# Patient Record
Sex: Male | Born: 1985 | Race: Black or African American | Hispanic: No | Marital: Married | Smoking: Current every day smoker
Health system: Southern US, Community
[De-identification: ages and names within clinical notes are randomized; demographics above are authoritative.]

## PROBLEM LIST (undated history)

## (undated) DIAGNOSIS — J45909 Unspecified asthma, uncomplicated: Secondary | ICD-10-CM

## (undated) DIAGNOSIS — M1990 Unspecified osteoarthritis, unspecified site: Secondary | ICD-10-CM

## (undated) DIAGNOSIS — J40 Bronchitis, not specified as acute or chronic: Secondary | ICD-10-CM

## (undated) DIAGNOSIS — R569 Unspecified convulsions: Secondary | ICD-10-CM

---

## 2019-05-09 ENCOUNTER — Emergency Department (HOSPITAL_COMMUNITY)
Admission: EM | Admit: 2019-05-09 | Discharge: 2019-05-09 | Disposition: A | Discharge status: Home / Self Care | Payer: Self-pay | Attending: Emergency Medicine | Admitting: Emergency Medicine

## 2019-05-09 ENCOUNTER — Emergency Department (HOSPITAL_COMMUNITY): Payer: Self-pay

## 2019-05-09 ENCOUNTER — Other Ambulatory Visit: Payer: Self-pay

## 2019-05-09 ENCOUNTER — Encounter (HOSPITAL_COMMUNITY): Payer: Self-pay | Admitting: Emergency Medicine

## 2019-05-09 DIAGNOSIS — Y9389 Activity, other specified: Secondary | ICD-10-CM | POA: Insufficient documentation

## 2019-05-09 DIAGNOSIS — Y929 Unspecified place or not applicable: Secondary | ICD-10-CM | POA: Insufficient documentation

## 2019-05-09 DIAGNOSIS — X500XXA Overexertion from strenuous movement or load, initial encounter: Secondary | ICD-10-CM | POA: Insufficient documentation

## 2019-05-09 DIAGNOSIS — S39012A Strain of muscle, fascia and tendon of lower back, initial encounter: Secondary | ICD-10-CM | POA: Insufficient documentation

## 2019-05-09 DIAGNOSIS — Y999 Unspecified external cause status: Secondary | ICD-10-CM | POA: Insufficient documentation

## 2019-05-09 HISTORY — DX: Unspecified osteoarthritis, unspecified site: M19.90

## 2019-05-09 MED ORDER — NAPROXEN 500 MG PO TABS: 500.0000 mg | ORAL_TABLET | Freq: Two times a day (BID) | ORAL | 0 refills | Status: DC

## 2019-05-09 MED ORDER — METHOCARBAMOL 500 MG PO TABS: 500.0000 mg | ORAL_TABLET | Freq: Two times a day (BID) | ORAL | 0 refills | Status: DC

## 2019-05-09 NOTE — ED Notes (Signed)
Patient transported to X-ray 

## 2019-05-09 NOTE — ED Provider Notes (Signed)
[facility] EMERGENCY DEPARTMENT Provider Note   CSN: [id number] Arrival date & time: 05/09/19  6948     History Chief Complaint  Patient presents with  . Back Pain    Lawrence Robinson is a 34 y.o. male who presents to ED with a chief complaint of back pain.  States that 2 days ago he was lifting a heavy Tanner of water off of the counter and felt a "pinching" in his lower back.  Has had waxing and waning pain 2 days ago with symptoms improving yesterday.  Today coughed and then felt a similar pinching sensation in his lower back.  Pain will intermittently radiate to his flank and down his legs depending on his positioning.  Reports history of similar symptoms several years ago during workouts.  He has tried Advil yesterday with some improvement in his pain.  Denies any dysuria, hematuria, injuries or falls, numbness in arms or legs, saddle anesthesia, loss of bowel bladder function, history of cancer, she of IV drug use or prior back surgeries. He remains ambulatory.  HPI     Past Medical History:  Diagnosis Date  . Arthritis     There are no problems to display for this patient.      No family history on file.  Social History   Tobacco Use  . Smoking status: Not on file  Substance Use Topics  . Alcohol use: Not on file  . Drug use: Not on file    Home Medications Prior to Admission medications   Medication Sig Start Date End Date Taking? Authorizing Provider  methocarbamol (ROBAXIN) 500 MG tablet Take 1 tablet (500 mg total) by mouth 2 (two) times daily. 05/09/19   Ayren Zumbro, PA-C  naproxen (NAPROSYN) 500 MG tablet Take 1 tablet (500 mg total) by mouth 2 (two) times daily. 05/09/19   Delia Heady, PA-C    Allergies    Patient has no known allergies.  Review of Systems   Review of Systems  Constitutional: Negative for chills and fever.  Gastrointestinal: Negative for diarrhea and vomiting.  Musculoskeletal: Positive for back pain and myalgias.    Skin: Negative for wound.  Neurological: Negative for weakness and numbness.    Physical Exam Updated Vital Signs BP 126/79 (BP Location: Left Arm)   Pulse 63   Temp 98 F (36.7 C)   Resp 16   Ht 6' (1.829 m)   Wt 79.8 kg   SpO2 99%   BMI 23.87 kg/m   Physical Exam Vitals and nursing note reviewed.  Constitutional:      General: He is not in acute distress.    Appearance: He is well-developed. He is not diaphoretic.  HENT:     Head: Normocephalic and atraumatic.  Eyes:     General: No scleral icterus.    Conjunctiva/sclera: Conjunctivae normal.  Cardiovascular:     Rate and Rhythm: Normal rate and regular rhythm.     Heart sounds: Normal heart sounds.  Pulmonary:     Effort: Pulmonary effort is normal. No respiratory distress.     Breath sounds: Normal breath sounds.  Musculoskeletal:     Cervical back: Normal range of motion.       Back:     Comments: Tenderness to palpation at the midline and paraspinal musculature of the lumbar spine as noted in the image. No midline spinal tenderness present in thoracic or cervical spine. No step-off palpated. No visible bruising, edema or temperature change noted. No objective signs of  numbness present. No saddle anesthesia. 2+ DP pulses bilaterally. Sensation intact to light touch. Strength 5/5 in bilateral lower extremities.  Skin:    Findings: No rash.  Neurological:     Mental Status: He is alert.     ED Results / Procedures / Treatments   Labs (all labs ordered are listed, but only abnormal results are displayed) Labs Reviewed - No data to display  EKG None  Radiology DG Lumbar Spine Complete  Result Date: 05/09/2019 CLINICAL DATA:  Midline back pain after heavy lifting. EXAM: LUMBAR SPINE - COMPLETE 4+ VIEW COMPARISON:  None. FINDINGS: This report assumes 5 non rib-bearing lumbar vertebrae. Lumbar vertebral body heights are preserved, with no fracture. Lumbar disc heights are preserved. No spondylosis. No  spondylolisthesis. No appreciable facet arthropathy. No aggressive appearing focal osseous lesions. IMPRESSION: No lumbar spine fracture, spondylolisthesis or significant degenerative changes. Electronically Signed   By: Delbert Phenix M.D.   On: 05/09/2019 10:41    Procedures Procedures (including critical care time)  Medications Ordered in ED Medications - No data to display  ED Course  I have reviewed the triage vital signs and the nursing notes.  Pertinent labs & imaging results that were available during my care of the patient were reviewed by me and considered in my medical decision making (see chart for details).    MDM Rules/Calculators/A&P                      Patient denies any concerning symptoms suggestive of cauda equina requiring urgent imaging at this time such as loss of sensation in the lower extremities, lower extremity weakness, loss of bowel or bladder control, saddle anesthesia, urinary retention, fever/chills, IVDU. Exam demonstrated no  weakness on exam today. No preceding injury or trauma to suggest acute fracture. Doubt pelvic or urinary pathology for patient's acute back pain, as patient denies urinary symptoms. Doubt AAA as cause of patient's back pain as patient lacks major risk factors, had no abdominal TTP, and has symmetric and intact distal pulses. Patient given strict return precautions for any symptoms indicating worsening neurologic function in the lower extremities.  X-ray was obtained due to midline tenderness symptoms negative for acute abnormality.  Patient remains ambulatory here.  Will treat for lumbar strain with NSAIDs and anti-inflammatories.  Patient is hemodynamically stable, in NAD, and able to ambulate in the ED. Evaluation does not show pathology that would require ongoing emergent intervention or inpatient treatment. I explained the diagnosis to the patient. Pain has been managed and has no complaints prior to discharge. Patient is comfortable with  above plan and is stable for discharge at this time. All questions were answered prior to disposition. Strict return precautions for returning to the ED were discussed. Encouraged follow up with PCP.   An After Visit Summary was printed and given to the patient.   Portions of this note were generated with Scientist, clinical (histocompatibility and immunogenetics). Dictation errors may occur despite best attempts at proofreading.   Final Clinical Impression(s) / ED Diagnoses Final diagnoses:  Strain of lumbar region, initial encounter    Rx / DC Orders ED Discharge Orders         Ordered    methocarbamol (ROBAXIN) 500 MG tablet  2 times daily     05/09/19 1056    naproxen (NAPROSYN) 500 MG tablet  2 times daily     05/09/19 [address] [place], PA-C 05/09/19 1058  Virgina Norfolk, DO 05/09/19 [phone]

## 2019-05-09 NOTE — Discharge Instructions (Signed)
Take the medications as directed. Return to the ED if you start to develop worsening pain, inability to walk, losing control of your bowels or bladder, shortness of breath or chest pain.

## 2019-05-09 NOTE — ED Triage Notes (Signed)
Pt states 2 days ago he lifted 20 lb of water off of a counter and felt "something pinch in his lower back". Pt states since he has had lower back pain sometimes moves into left leg. No saddle  Numbness

## 2020-07-05 IMAGING — CR DG LUMBAR SPINE COMPLETE 4+V
5 series · 5 of 5 positions shown · non-contrast
Comparison: None.

CLINICAL DATA: Midline back pain after heavy lifting.

EXAM:
LUMBAR SPINE - COMPLETE 4+ VIEW

[l-spine ap]
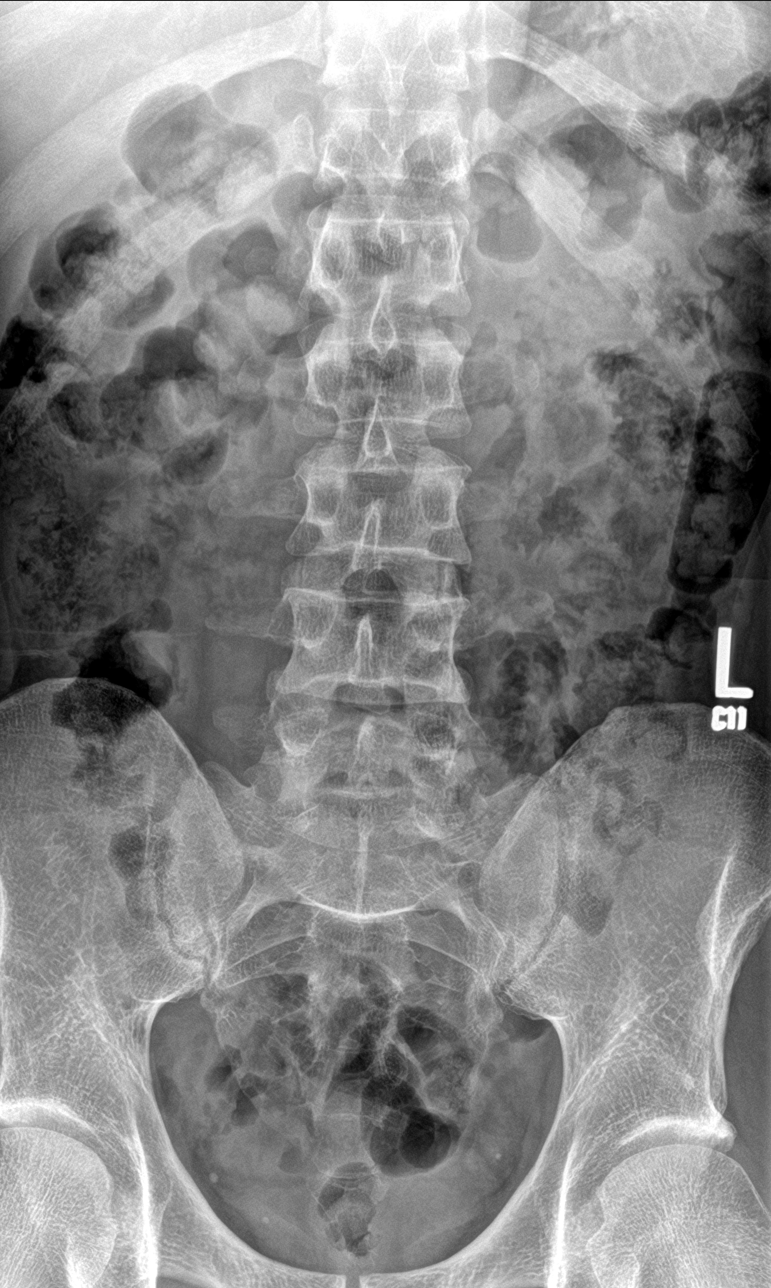

[l-spine obl (1 of 2)]
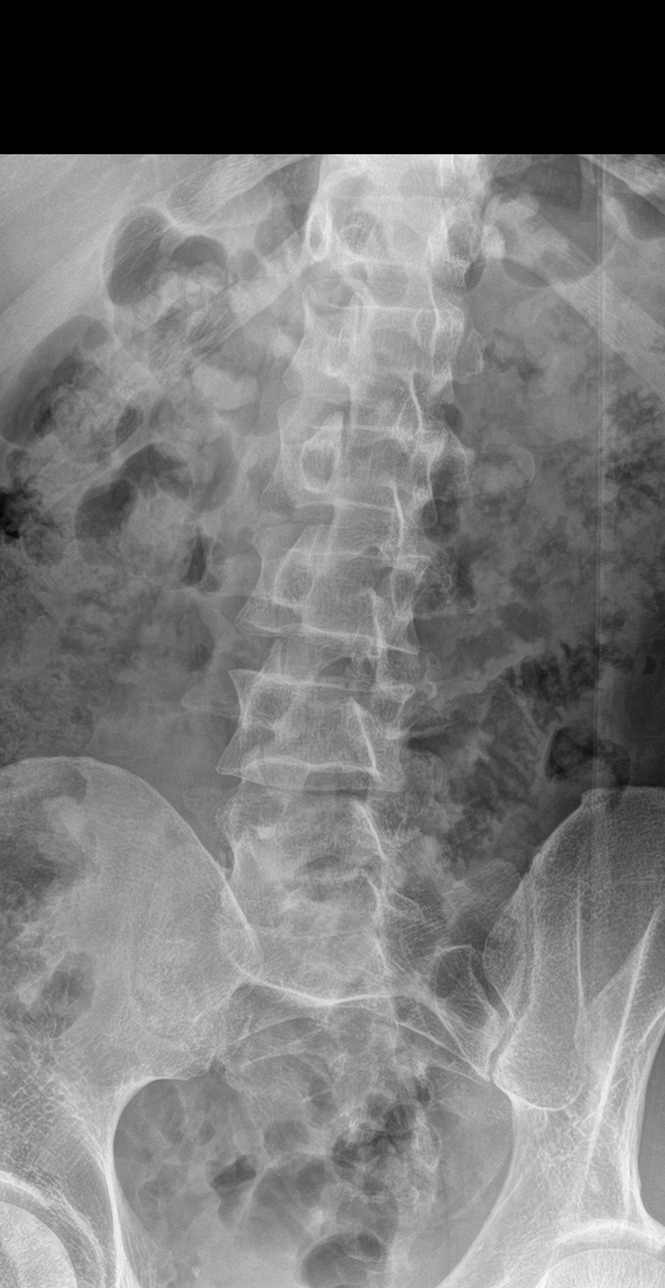

[l-spine obl (2 of 2)]
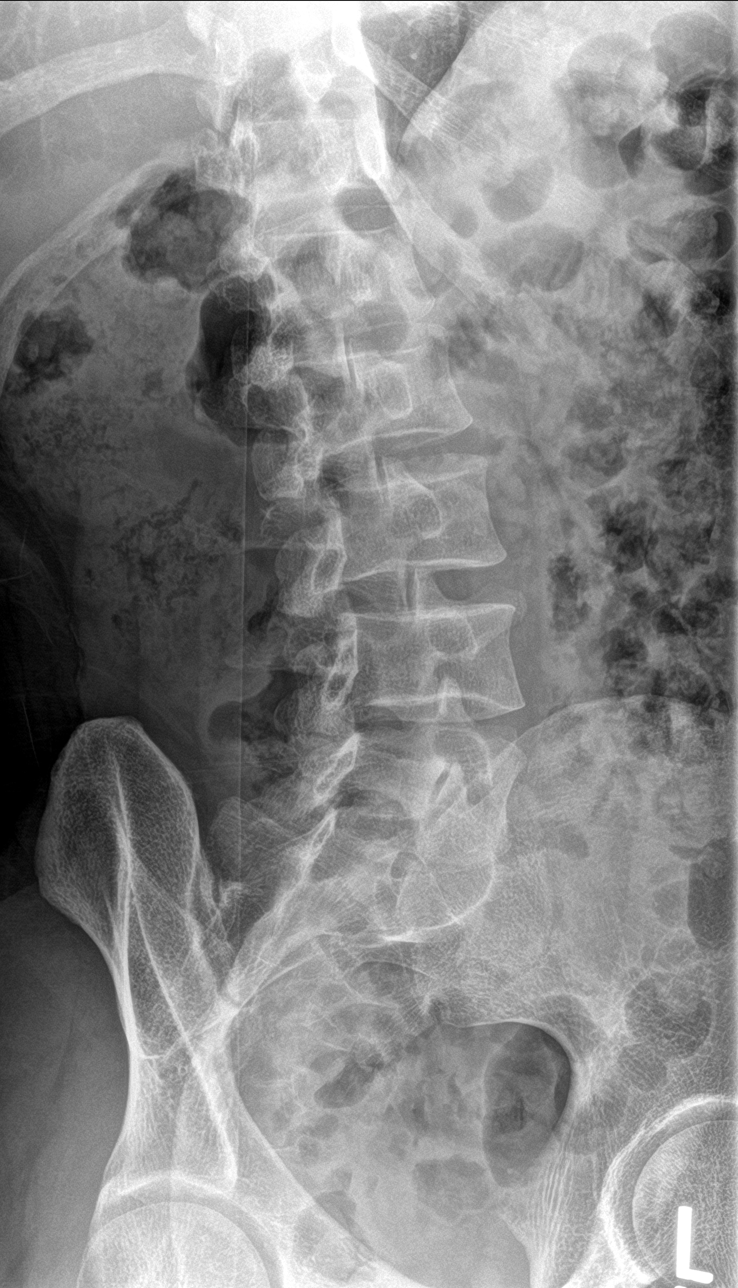

[l-spine lat]
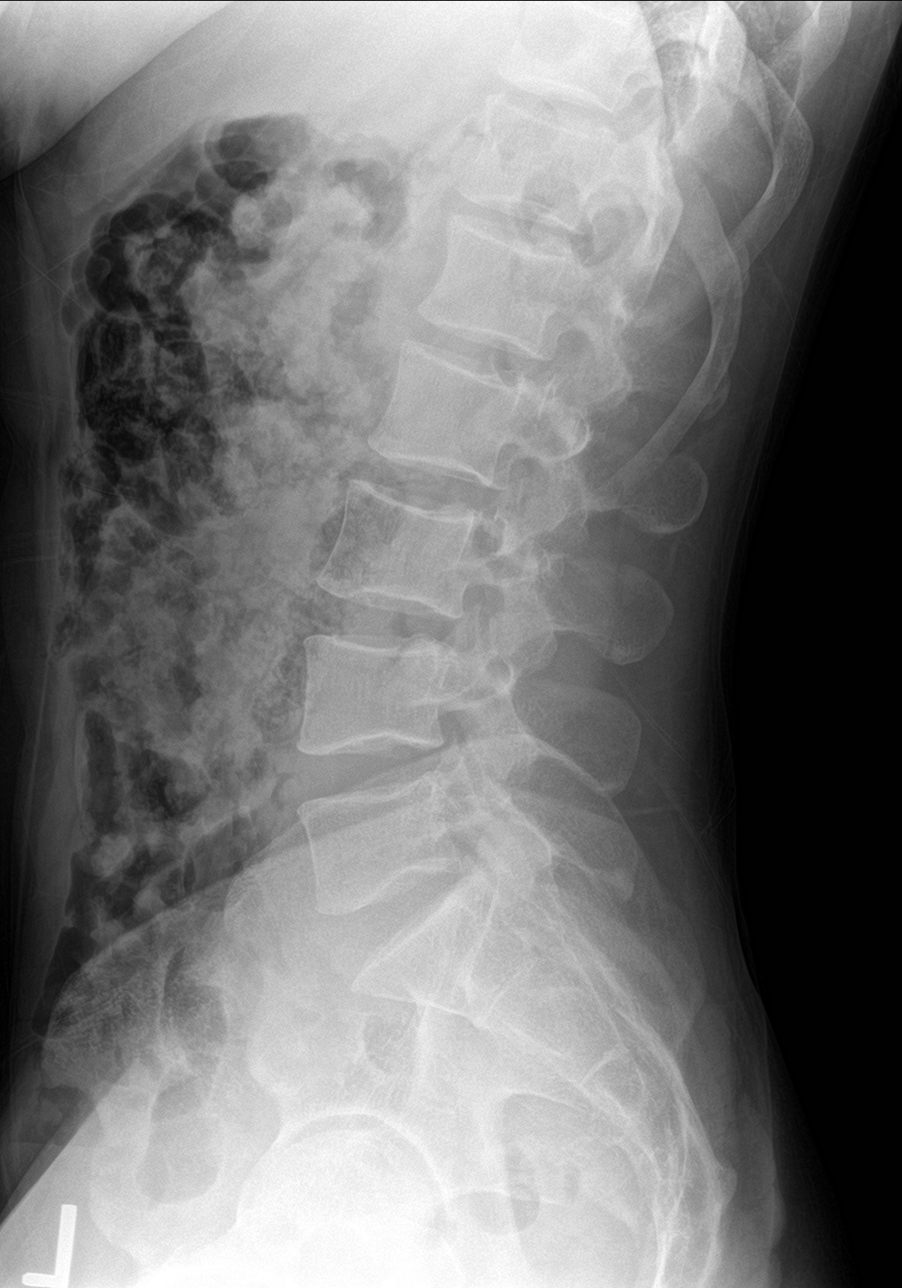

[l-spine spot]
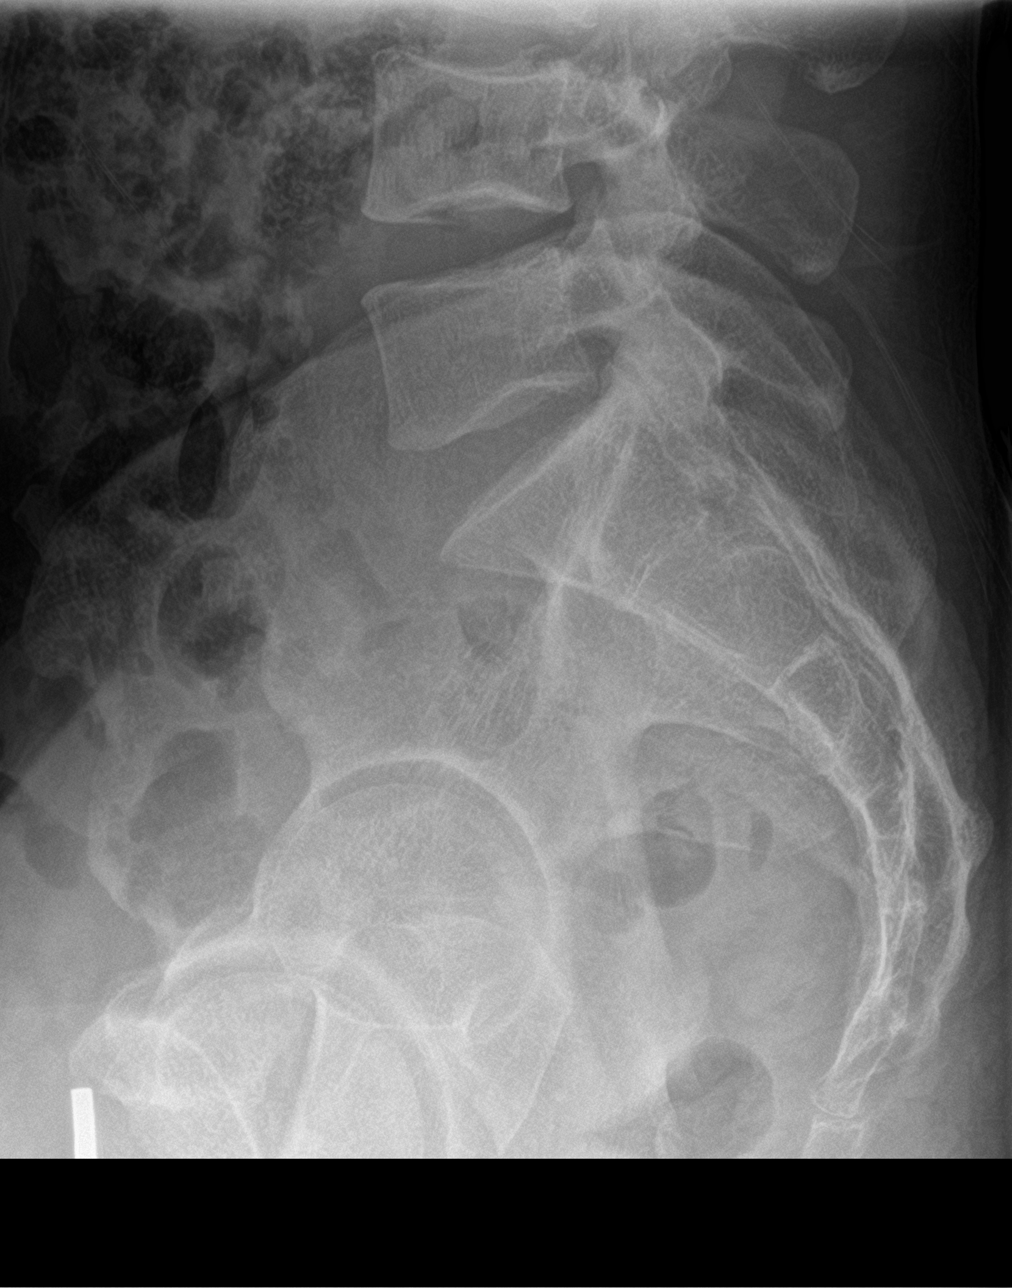

[5 of 5 positions shown; findings below may reference images not displayed]

FINDINGS: This report assumes 5 non rib-bearing lumbar vertebrae.

Lumbar vertebral body heights are preserved, with no fracture.

Lumbar disc heights are preserved. No spondylosis. No
spondylolisthesis. No appreciable facet arthropathy. No aggressive
appearing focal osseous lesions.
IMPRESSION: No lumbar spine fracture, spondylolisthesis or significant
degenerative changes.

## 2022-03-24 ENCOUNTER — Emergency Department (HOSPITAL_BASED_OUTPATIENT_CLINIC_OR_DEPARTMENT_OTHER)
Admission: EM | Admit: 2022-03-24 | Discharge: 2022-03-25 | Disposition: A | Discharge status: Home / Self Care | Payer: Medicaid Other | Attending: Emergency Medicine | Admitting: Emergency Medicine

## 2022-03-24 ENCOUNTER — Encounter (HOSPITAL_BASED_OUTPATIENT_CLINIC_OR_DEPARTMENT_OTHER): Payer: Self-pay | Admitting: Emergency Medicine

## 2022-03-24 ENCOUNTER — Other Ambulatory Visit: Payer: Self-pay

## 2022-03-24 DIAGNOSIS — J45909 Unspecified asthma, uncomplicated: Secondary | ICD-10-CM | POA: Insufficient documentation

## 2022-03-24 DIAGNOSIS — F17210 Nicotine dependence, cigarettes, uncomplicated: Secondary | ICD-10-CM | POA: Insufficient documentation

## 2022-03-24 DIAGNOSIS — K610 Anal abscess: Secondary | ICD-10-CM | POA: Insufficient documentation

## 2022-03-24 HISTORY — DX: Unspecified convulsions: R56.9

## 2022-03-24 HISTORY — DX: Bronchitis, not specified as acute or chronic: J40

## 2022-03-24 HISTORY — DX: Unspecified asthma, uncomplicated: J45.909

## 2022-03-24 MED ORDER — LIDOCAINE-EPINEPHRINE (PF) 2 %-1:200000 IJ SOLN
10.0000 mL | Freq: Once | INTRAMUSCULAR | Status: DC
  Filled 2022-03-24: qty 20

## 2022-03-24 NOTE — ED Provider Notes (Incomplete)
   [facility] Provider Note: Lowella Dell, MD, FACEP  CSN: [id number] MRN: [id number] ARRIVAL: 03/24/22 at 2307 ROOM: MH10/MH10   CHIEF COMPLAINT  Abscess   HISTORY OF PRESENT ILLNESS  03/24/22 11:58 PM Lawrence Robinson is a 36 y.o. male with a tender, swollen area of his coccyx for the past 2 days.  He rates associated pain as a 9 out of 10, throbbing in nature.    Past Medical History:  Diagnosis Date  . Arthritis   . Asthma   . Bronchitis   . Seizures (HCC)     History reviewed. No pertinent surgical history.  Family History  Problem Relation Age of Onset  . Cancer Mother   . Cancer Other     Social History   Tobacco Use  . Smoking status: Every Day    Packs/day: 0.50    Types: Cigarettes  . Smokeless tobacco: Never  Vaping Use  . Vaping Use: Never used  Substance Use Topics  . Alcohol use: Yes    Comment: social  . Drug use: Not Currently    Prior to Admission medications   Medication Sig Start Date End Date Taking? Authorizing Provider  methocarbamol (ROBAXIN) 500 MG tablet Take 1 tablet (500 mg total) by mouth 2 (two) times daily. 05/09/19   Khatri, Hina, PA-C  naproxen (NAPROSYN) 500 MG tablet Take 1 tablet (500 mg total) by mouth 2 (two) times daily. 05/09/19   Dietrich Pates, PA-C    Allergies Patient has no known allergies.   REVIEW OF SYSTEMS  Negative except as noted here or in the History of Present Illness.   PHYSICAL EXAMINATION  Initial Vital Signs Blood pressure 113/89, pulse 85, temperature 97.9 F (36.6 C), temperature source Oral, resp. rate 14, height 6' (1.829 m), weight 86.6 kg, SpO2 97 %.  Examination General: Well-developed, well-nourished male in no acute distress; appearance consistent with age of record HENT: normocephalic; atraumatic Eyes: pupils equal, round and reactive to light; extraocular muscles intact Neck: supple Heart: regular rate and rhythm; no murmurs, rubs or gallops Lungs: clear to auscultation  bilaterally Abdomen: soft; nondistended; nontender; no masses or hepatosplenomegaly; bowel sounds present Extremities: No deformity; full range of motion; pulses normal Neurologic: Awake, alert and oriented; motor function intact in all extremities and symmetric; no facial droop Skin: Warm and dry Psychiatric: Normal mood and affect   RESULTS  Summary of this visit's results, reviewed and interpreted by myself:   EKG Interpretation  Date/Time:    Ventricular Rate:    PR Interval:    QRS Duration:   QT Interval:    QTC Calculation:   R Axis:     Text Interpretation:         Laboratory Studies: No results found for this or any previous visit (from the past 24 hour(s)). Imaging Studies: No results found.  ED COURSE and MDM  Nursing notes, initial and subsequent vitals signs, including pulse oximetry, reviewed and interpreted by myself.  Vitals:   03/24/22 2313 03/24/22 2318  BP:  113/89  Pulse:  85  Resp:  14  Temp:  97.9 F (36.6 C)  TempSrc:  Oral  SpO2:  97%  Weight: 86.6 kg   Height: 6' (1.829 m)    Medications - No data to display    PROCEDURES  Procedures   ED DIAGNOSES  No diagnosis found.

## 2022-03-24 NOTE — ED Provider Notes (Signed)
[facility] Provider Note: Lowella Dell, MD, FACEP  CSN: [id number] MRN: [id number] ARRIVAL: 03/24/22 at 2307 ROOM: MH10/MH10   CHIEF COMPLAINT  Abscess   HISTORY OF PRESENT ILLNESS  03/24/22 11:58 PM Lawrence Robinson is a 36 y.o. male with a tender area on the right side of his anus for the past 2 days.  He rates associated pain as a 9 out of 10, throbbing in nature.  It is worse with bowel movements.   Past Medical History:  Diagnosis Date   Arthritis    Asthma    Bronchitis    Seizures (HCC)     History reviewed. No pertinent surgical history.  Family History  Problem Relation Age of Onset   Cancer Mother    Cancer Other     Social History   Tobacco Use   Smoking status: Every Day    Packs/day: 0.50    Types: Cigarettes   Smokeless tobacco: Never  Vaping Use   Vaping Use: Never used  Substance Use Topics   Alcohol use: Yes    Comment: social   Drug use: Not Currently    Prior to Admission medications   Medication Sig Start Date End Date Taking? Authorizing Provider  ciprofloxacin (CIPRO) 500 MG tablet Take 1 tablet (500 mg total) by mouth 2 (two) times daily. 03/25/22  Yes Montarius Kitagawa, MD  metroNIDAZOLE (FLAGYL) 500 MG tablet Take 1 tablet (500 mg total) by mouth 2 (two) times daily. One po bid x 7 days 03/25/22  Yes Ricca Melgarejo, MD    Allergies Patient has no known allergies.   REVIEW OF SYSTEMS  Negative except as noted here or in the History of Present Illness.   PHYSICAL EXAMINATION  Initial Vital Signs Blood pressure 113/89, pulse 85, temperature 97.9 F (36.6 C), temperature source Oral, resp. rate 14, height 6' (1.829 m), weight 86.6 kg, SpO2 97 %.  Examination General: Well-developed, well-nourished male in no acute distress; appearance consistent with age of record HENT: normocephalic; atraumatic Eyes: Normal appearance Neck: supple Heart: regular rate and rhythm Lungs: clear to auscultation bilaterally Abdomen: soft;  nondistended; nontender; bowel sounds present Rectal: Normal sphincter tone; no hemorrhoids seen or palpated; tenderness to the right side of the anus without significant induration or fluctuant mass palpated Extremities: No deformity; full range of motion Neurologic: Awake, alert and oriented; motor function intact in all extremities and symmetric; no facial droop Skin: Warm and dry Psychiatric: Normal mood and affect   RESULTS  Summary of this visit's results, reviewed and interpreted by myself:   EKG Interpretation  Date/Time:    Ventricular Rate:    PR Interval:    QRS Duration:   QT Interval:    QTC Calculation:   R Axis:     Text Interpretation:         Laboratory Studies: No results found for this or any previous visit (from the past 24 hour(s)). Imaging Studies: No results found.  ED COURSE and MDM  Nursing notes, initial and subsequent vitals signs, including pulse oximetry, reviewed and interpreted by myself.  Vitals:   03/24/22 2313 03/24/22 2318  BP:  113/89  Pulse:  85  Resp:  14  Temp:  97.9 F (36.6 C)  TempSrc:  Oral  SpO2:  97%  Weight: 86.6 kg   Height: 6' (1.829 m)    Medications  lidocaine-EPINEPHrine (XYLOCAINE W/EPI) 2 %-1:200000 (PF) injection 10 mL (has no administration in time range)  metroNIDAZOLE (FLAGYL) tablet 500 mg (  has no administration in time range)  ciprofloxacin (CIPRO) tablet 500 mg (has no administration in time range)    The patient may have an early perianal abscess that has not yet declared itself on physical examination.  We will start him on antibiotics and have him return if symptoms worsen.  I&D is not indicated at the present time.  PROCEDURES  Procedures   ED DIAGNOSES     ICD-10-CM   1. Perianal abscess  K61.0          Tony Granquist, Jonny Ruiz, MD 03/25/22 [phone]

## 2022-03-24 NOTE — ED Triage Notes (Signed)
Pt states he has a boil on his coccyx area  Started about 2 days ago

## 2022-03-25 MED ORDER — CIPROFLOXACIN HCL 500 MG PO TABS: 500.0000 mg | ORAL_TABLET | Freq: Two times a day (BID) | ORAL | 0 refills | Status: DC

## 2022-03-25 MED ORDER — CIPROFLOXACIN HCL 500 MG PO TABS
500.0000 mg | ORAL_TABLET | Freq: Once | ORAL | Status: AC
  Administered 2022-03-25: 500 mg via ORAL
  Filled 2022-03-25: qty 1

## 2022-03-25 MED ORDER — METRONIDAZOLE 500 MG PO TABS: 500.0000 mg | ORAL_TABLET | Freq: Two times a day (BID) | ORAL | 0 refills | Status: DC

## 2022-03-25 MED ORDER — METRONIDAZOLE 500 MG PO TABS
500.0000 mg | ORAL_TABLET | Freq: Once | ORAL | Status: AC
  Administered 2022-03-25: 500 mg via ORAL
  Filled 2022-03-25: qty 1

## 2022-03-25 NOTE — ED Notes (Signed)
D/c paperwork reviewed with pt, including prescriptions. No questions or concerns at time of d/c, ambulatory to ED exit without assistance.

## 2022-03-31 ENCOUNTER — Other Ambulatory Visit (HOSPITAL_BASED_OUTPATIENT_CLINIC_OR_DEPARTMENT_OTHER): Payer: Self-pay

## 2022-03-31 ENCOUNTER — Emergency Department (HOSPITAL_BASED_OUTPATIENT_CLINIC_OR_DEPARTMENT_OTHER): Payer: Self-pay

## 2022-03-31 ENCOUNTER — Other Ambulatory Visit: Payer: Self-pay

## 2022-03-31 ENCOUNTER — Encounter (HOSPITAL_BASED_OUTPATIENT_CLINIC_OR_DEPARTMENT_OTHER): Payer: Self-pay

## 2022-03-31 ENCOUNTER — Emergency Department (HOSPITAL_BASED_OUTPATIENT_CLINIC_OR_DEPARTMENT_OTHER)
Admission: EM | Admit: 2022-03-31 | Discharge: 2022-03-31 | Disposition: A | Discharge status: Home / Self Care | Payer: Self-pay | Attending: Emergency Medicine | Admitting: Emergency Medicine

## 2022-03-31 DIAGNOSIS — K603 Anal fistula: Secondary | ICD-10-CM | POA: Insufficient documentation

## 2022-03-31 DIAGNOSIS — K610 Anal abscess: Secondary | ICD-10-CM | POA: Insufficient documentation

## 2022-03-31 LAB — CBC WITH DIFFERENTIAL/PLATELET
Abs Immature Granulocytes: 0.02 10*3/uL (ref 0.00–0.07)
Basophils Absolute: 0.1 10*3/uL (ref 0.0–0.1)
Basophils Relative: 1 %
Eosinophils Absolute: 0.7 10*3/uL — ABNORMAL HIGH (ref 0.0–0.5)
Eosinophils Relative: 8 %
HCT: 42.4 % (ref 39.0–52.0)
Hemoglobin: 14.1 g/dL (ref 13.0–17.0)
Immature Granulocytes: 0 %
Lymphocytes Relative: 23 %
Lymphs Abs: 2.2 10*3/uL (ref 0.7–4.0)
MCH: 29.9 pg (ref 26.0–34.0)
MCHC: 33.3 g/dL (ref 30.0–36.0)
MCV: 90 fL (ref 80.0–100.0)
Monocytes Absolute: 0.8 10*3/uL (ref 0.1–1.0)
Monocytes Relative: 8 %
Neutro Abs: 5.6 10*3/uL (ref 1.7–7.7)
Neutrophils Relative %: 60 %
Platelets: 222 10*3/uL (ref 150–400)
RBC: 4.71 MIL/uL (ref 4.22–5.81)
RDW: 12.2 % (ref 11.5–15.5)
WBC: 9.4 10*3/uL (ref 4.0–10.5)
nRBC: 0 % (ref 0.0–0.2)

## 2022-03-31 LAB — BASIC METABOLIC PANEL
Anion gap: 4 — ABNORMAL LOW (ref 5–15)
BUN: 16 mg/dL (ref 6–20)
CO2: 27 mmol/L (ref 22–32)
Calcium: 9.2 mg/dL (ref 8.9–10.3)
Chloride: 109 mmol/L (ref 98–111)
Creatinine, Ser: 1.11 mg/dL (ref 0.61–1.24)
GFR, Estimated: >60 mL/min (ref >60)
Glucose, Bld: 125 mg/dL — ABNORMAL HIGH (ref 70–99)
Potassium: 4.1 mmol/L (ref 3.5–5.1)
Sodium: 140 mmol/L (ref 135–145)

## 2022-03-31 MED ORDER — IOHEXOL 300 MG/ML  SOLN
100.0000 mL | Freq: Once | INTRAMUSCULAR | Status: AC | PRN
  Administered 2022-03-31: 100 mL via INTRAVENOUS

## 2022-03-31 MED ORDER — HYDROCODONE-ACETAMINOPHEN 5-325 MG PO TABS
1.0000 | ORAL_TABLET | Freq: Four times a day (QID) | ORAL | 0 refills | Status: DC | PRN
  Filled 2022-03-31: qty 14, 4d supply, fill #0

## 2022-03-31 MED ORDER — ONDANSETRON HCL 4 MG/2ML IJ SOLN: 4.0000 mg | Freq: Once | INTRAMUSCULAR | Status: DC

## 2022-03-31 MED ORDER — AMOXICILLIN-POT CLAVULANATE 875-125 MG PO TABS
1.0000 | ORAL_TABLET | Freq: Two times a day (BID) | ORAL | 0 refills | Status: AC
  Filled 2022-03-31: qty 14, 7d supply, fill #0

## 2022-03-31 NOTE — ED Provider Notes (Signed)
[facility] EMERGENCY DEPARTMENT Provider Note   CSN: [id number] Arrival date & time: 03/31/22  8921     History  Chief Complaint  Patient presents with   Testicle Pain   Abscess    Lawrence Robinson is a 36 y.o. male.  Patient has been struggling with some discomfort in the perianal area for several weeks now had a problem prior to being seen here on November 29.  Patient with that time was treated with Cipro and Flagyl.  I did not do a CT but there was no clinical evidence of a deep abscess.  Patient states he never really got better here today started have increased pain and now has purulent drainage occurring.  Says he has discomfort in the testicles but there is no scrotal swelling.  No fevers temp is 97.6 oxygen sats 95% blood pressure is 113/71 and heart rate is 82 respiration 17.  Patient has never had a known abscess there before patient's everyday smoker.  No known allergies.  Denies any nausea or vomiting.  Denies any fever or chills.       Home Medications Prior to Admission medications   Medication Sig Start Date End Date Taking? Authorizing Provider  amoxicillin-clavulanate (AUGMENTIN) 875-125 MG tablet Take 1 tablet by mouth every 12 (twelve) hours for 7 days. 03/31/22 04/07/22 Yes Vanetta Mulders, MD  HYDROcodone-acetaminophen (NORCO/VICODIN) 5-325 MG tablet Take 1 tablet by mouth every 6 (six) hours as needed. 03/31/22  Yes Blue, Soijett A, PA-C  ciprofloxacin (CIPRO) 500 MG tablet Take 1 tablet (500 mg total) by mouth 2 (two) times daily. 03/25/22   Molpus, John, MD  metroNIDAZOLE (FLAGYL) 500 MG tablet Take 1 tablet (500 mg total) by mouth 2 (two) times daily. One po bid x 7 days 03/25/22   Molpus, Jonny Ruiz, MD      Allergies    Patient has no known allergies.    Review of Systems   Review of Systems  Constitutional:  Negative for chills, diaphoresis, fatigue and fever.  HENT:  Negative for congestion, rhinorrhea and sore throat.   Eyes:  Negative for visual  disturbance.  Respiratory:  Negative for cough and shortness of breath.   Cardiovascular:  Negative for chest pain and leg swelling.  Gastrointestinal:  Negative for abdominal pain, diarrhea, nausea and vomiting.  Genitourinary:  Positive for testicular pain. Negative for dysuria.  Musculoskeletal:  Negative for back pain and neck pain.  Skin:  Positive for wound. Negative for rash.  Neurological:  Negative for dizziness, light-headedness and headaches.  Hematological:  Does not bruise/bleed easily.  Psychiatric/Behavioral:  Negative for confusion.     Physical Exam Updated Vital Signs BP 105/76 (BP Location: Left Arm)   Pulse 70   Temp 97.9 F (36.6 C) (Oral)   Resp 18   Ht 1.829 m (6')   Wt 86.6 kg   SpO2 100%   BMI 25.90 kg/m  Physical Exam Vitals and nursing note reviewed.  Constitutional:      General: He is not in acute distress.    Appearance: Normal appearance. He is well-developed.  HENT:     Head: Normocephalic and atraumatic.     Mouth/Throat:     Mouth: Mucous membranes are moist.  Eyes:     Extraocular Movements: Extraocular movements intact.     Conjunctiva/sclera: Conjunctivae normal.     Pupils: Pupils are equal, round, and reactive to light.  Cardiovascular:     Rate and Rhythm: Normal rate and regular rhythm.  Heart sounds: No murmur heard. Pulmonary:     Effort: Pulmonary effort is normal. No respiratory distress.     Breath sounds: Normal breath sounds.  Abdominal:     Palpations: Abdomen is soft.     Tenderness: There is no abdominal tenderness.  Genitourinary:    Penis: Normal.      Testes: Normal.     Comments: Scrotum normal.  No swelling no induration no tenderness.  Perianal area there is purulent discharge on the right side.  Large amount of copious purulent material and bloody.  Opening measures about 5 mm in size.  Surrounding induration measuring about 4 cm.  Does not involve the scrotum.  No evidence of any anal  fissure. Musculoskeletal:        General: No swelling.     Cervical back: Normal range of motion and neck supple.  Skin:    General: Skin is warm and dry.     Capillary Refill: Capillary refill takes less than 2 seconds.  Neurological:     General: No focal deficit present.     Mental Status: He is alert and oriented to person, place, and time.     Cranial Nerves: No cranial nerve deficit.     Sensory: No sensory deficit.     Motor: No weakness.  Psychiatric:        Mood and Affect: Mood normal.     ED Results / Procedures / Treatments   Labs (all labs ordered are listed, but only abnormal results are displayed) Labs Reviewed  CBC WITH DIFFERENTIAL/PLATELET - Abnormal; Notable for the following components:      Result Value   Eosinophils Absolute 0.7 (*)    All other components within normal limits  BASIC METABOLIC PANEL - Abnormal; Notable for the following components:   Glucose, Bld 125 (*)    Anion gap 4 (*)    All other components within normal limits    EKG None  Radiology CT Abdomen Pelvis W Contrast  Result Date: 03/31/2022 CLINICAL DATA:  Left lower quadrant pain. History of draining perianal abscess. EXAM: CT ABDOMEN AND PELVIS WITH CONTRAST TECHNIQUE: Multidetector CT imaging of the abdomen and pelvis was performed using the standard protocol following bolus administration of intravenous contrast. RADIATION DOSE REDUCTION: This exam was performed according to the departmental dose-optimization program which includes automated exposure control, adjustment of the mA and/or kV according to patient size and/or use of iterative reconstruction technique. CONTRAST:  OMNIPAQUE IOHEXOL 300 MG/ML  SOLN COMPARISON:  None Available. FINDINGS: Lower chest: Insert lung bases Hepatobiliary: No focal hepatic lesions or intrahepatic biliary dilatation. The gallbladder is normal. No common bile duct dilatation. Pancreas: Normal Spleen: Normal Adrenals/Urinary Tract: Normal  Stomach/Bowel: The stomach, duodenum, small bowel and colon are unremarkable. No acute inflammatory process, mass lesions or obstructive findings. The terminal ileum and appendix are normal. Vascular/Lymphatic: The aorta is normal in caliber. No dissection. Scattered age advanced atherosclerotic calcifications involving the distal aorta and iliac arteries. The major venous structures are patent. No mesenteric or retroperitoneal mass or adenopathy. Small scattered lymph nodes are noted. Reproductive: The prostate gland and seminal vesicles are unremarkable. Other: Perianal fistula are not well evaluated with CT. I suspect there is a perianal fistula on the right side extending down to the perineum but I do not see a gross abscess. MRI pelvis without and with contrast using perianal fistula protocol would be the best way to evaluate this. Musculoskeletal: No significant bony findings. IMPRESSION: 1. Perianal fistula  are not well evaluated with CT. I suspect there is a perianal fistula on the right side extending down to the perineum but I do not see a gross abscess. MRI pelvis without and with contrast using perianal fistula protocol would be the best way to evaluate this. 2. No acute abdominal/pelvic findings, mass lesions or adenopathy. 3. Age advanced atherosclerotic calcifications involving the distal aorta and iliac arteries. Aortic Atherosclerosis (ICD10-I70.0). Electronically Signed   By: Rudie Meyer M.D.   On: 03/31/2022 10:48    Procedures Procedures    Medications Ordered in ED Medications  iohexol (OMNIPAQUE) 300 MG/ML solution 100 mL (100 mLs Intravenous Contrast Given 03/31/22 1027)    ED Course/ Medical Decision Making/ A&P                           Medical Decision Making Amount and/or Complexity of Data Reviewed Labs: ordered. Radiology: ordered.  Risk Prescription drug management.   Patient with what appears to be draining perianal abscess.  But decided to do a CT him because  of his complaint of the scrotal and testicular pain although no evidence of infection in that area.  CT she is shows no spreading of infection.  Raises concerns about an anal fistula.  May be up along the rectum.  Good drainage right now does not need to have this opened more.  We will send him to general surgery for additional follow-up.  We will change antibiotic to Augmentin.  Have him do sitz bath's twice a day.  Patient will return for any new or worse symptoms.  No leukocytosis White count is 9.4 hemoglobin 14.1 patient metabolic panel normal glucose 097 electrolytes normal.   Final Clinical Impression(s) / ED Diagnoses Final diagnoses:  Perianal abscess  Anal fistula    Rx / DC Orders ED Discharge Orders          Ordered    amoxicillin-clavulanate (AUGMENTIN) 875-125 MG tablet  Every 12 hours        03/31/22 1204    HYDROcodone-acetaminophen (NORCO/VICODIN) 5-325 MG tablet  Every 6 hours PRN        03/31/22 1209              Vanetta Mulders, MD 03/31/22 1218

## 2022-03-31 NOTE — ED Notes (Signed)
Attempted to get blood and IV access x 2. Unable to collect.

## 2022-03-31 NOTE — Discharge Instructions (Addendum)
Soak in the tub for the perianal abscess.  20 minutes twice a day.  Make an appointment follow-up with general surgery.  Take the new antibiotic Augmentin as directed.  Take the pain medication hydrocodone as directed.  Return for any new or worse symptoms.  As we discussed CT scan raise concerns about may be a anal fistula.

## 2022-03-31 NOTE — ED Triage Notes (Signed)
Seen here on 11/29 for perianal abscess. Completed course of antibiotics. States now having a pain that extends to scrotum.

## 2022-07-19 ENCOUNTER — Emergency Department (HOSPITAL_BASED_OUTPATIENT_CLINIC_OR_DEPARTMENT_OTHER): Payer: 59

## 2022-07-19 ENCOUNTER — Other Ambulatory Visit: Payer: Self-pay

## 2022-07-19 ENCOUNTER — Emergency Department (HOSPITAL_BASED_OUTPATIENT_CLINIC_OR_DEPARTMENT_OTHER)
Admission: EM | Admit: 2022-07-19 | Discharge: 2022-07-19 | Disposition: A | Discharge status: Home / Self Care | Payer: 59 | Attending: Emergency Medicine | Admitting: Emergency Medicine

## 2022-07-19 ENCOUNTER — Encounter (HOSPITAL_BASED_OUTPATIENT_CLINIC_OR_DEPARTMENT_OTHER): Payer: Self-pay | Admitting: Urology

## 2022-07-19 DIAGNOSIS — H6692 Otitis media, unspecified, left ear: Secondary | ICD-10-CM | POA: Insufficient documentation

## 2022-07-19 DIAGNOSIS — H9202 Otalgia, left ear: Secondary | ICD-10-CM | POA: Diagnosis present

## 2022-07-19 DIAGNOSIS — Z20822 Contact with and (suspected) exposure to covid-19: Secondary | ICD-10-CM | POA: Diagnosis not present

## 2022-07-19 DIAGNOSIS — J069 Acute upper respiratory infection, unspecified: Secondary | ICD-10-CM | POA: Diagnosis not present

## 2022-07-19 DIAGNOSIS — J45909 Unspecified asthma, uncomplicated: Secondary | ICD-10-CM | POA: Insufficient documentation

## 2022-07-19 DIAGNOSIS — H6690 Otitis media, unspecified, unspecified ear: Secondary | ICD-10-CM

## 2022-07-19 LAB — RESP PANEL BY RT-PCR (RSV, FLU A&B, COVID)  RVPGX2
Influenza A by PCR: NEGATIVE
Influenza B by PCR: NEGATIVE
Resp Syncytial Virus by PCR: NEGATIVE
SARS Coronavirus 2 by RT PCR: NEGATIVE

## 2022-07-19 MED ORDER — AMOXICILLIN 500 MG PO CAPS: 1000.0000 mg | ORAL_CAPSULE | Freq: Two times a day (BID) | ORAL | 0 refills | Status: AC

## 2022-07-19 MED ORDER — IPRATROPIUM-ALBUTEROL 0.5-2.5 (3) MG/3ML IN SOLN
3.0000 mL | RESPIRATORY_TRACT | Status: AC
  Administered 2022-07-19: 3 mL via RESPIRATORY_TRACT
  Filled 2022-07-19: qty 3

## 2022-07-19 MED ORDER — IBUPROFEN 400 MG PO TABS
600.0000 mg | ORAL_TABLET | Freq: Once | ORAL | Status: AC
  Administered 2022-07-19: 600 mg via ORAL
  Filled 2022-07-19: qty 1

## 2022-07-19 MED ORDER — FLUTICASONE PROPIONATE 50 MCG/ACT NA SUSP: 1.0000 | Freq: Every day | NASAL | 0 refills | Status: AC

## 2022-07-19 MED ORDER — ALBUTEROL SULFATE HFA 108 (90 BASE) MCG/ACT IN AERS: 1.0000 | INHALATION_SPRAY | Freq: Four times a day (QID) | RESPIRATORY_TRACT | 0 refills | Status: AC | PRN

## 2022-07-19 NOTE — ED Notes (Signed)
Patient transported to X-ray 

## 2022-07-19 NOTE — ED Provider Notes (Signed)
  [facility] Provider Note   CSN: [id number] Arrival date & time: 07/19/22  1737     History {Add pertinent medical, surgical, social history, OB history to HPI:1} Chief Complaint  Patient presents with   Otalgia    Lawrence Robinson is a 36 y.o. male.   Otalgia      Home Medications Prior to Admission medications   Medication Sig Start Date End Date Taking? Authorizing Provider  ciprofloxacin (CIPRO) 500 MG tablet Take 1 tablet (500 mg total) by mouth 2 (two) times daily. 03/25/22   Molpus, John, MD  HYDROcodone-acetaminophen (NORCO/VICODIN) 5-325 MG tablet Take 1 tablet by mouth every 6 (six) hours as needed. 03/31/22   Blue, Soijett A, PA-C  metroNIDAZOLE (FLAGYL) 500 MG tablet Take 1 tablet (500 mg total) by mouth 2 (two) times daily. One po bid x 7 days 03/25/22   Molpus, Jenny Reichmann, MD      Allergies    Patient has no known allergies.    Review of Systems   Review of Systems  HENT:  Positive for ear pain.     Physical Exam Updated Vital Signs BP (!) 132/101 (BP Location: Left Arm)   Pulse 64   Temp 98.2 F (36.8 C) (Oral)   Resp (!) 22   Ht 6' (1.829 m)   Wt 90.7 kg   SpO2 98%   BMI 27.12 kg/m  Physical Exam  ED Results / Procedures / Treatments   Labs (all labs ordered are listed, but only abnormal results are displayed) Labs Reviewed  RESP PANEL BY RT-PCR (RSV, FLU A&B, COVID)  RVPGX2    EKG None  Radiology DG Chest 2 View  Result Date: 07/19/2022 CLINICAL DATA:  Wheezing and cough EXAM: CHEST - 2 VIEW COMPARISON:  None Available. FINDINGS: The heart size and mediastinal contours are within normal limits. Both lungs are clear. The visualized skeletal structures are unremarkable. IMPRESSION: Lungs are clear. Electronically Signed   By: Yetta Glassman M.D.   On: 07/19/2022 18:51    Procedures Procedures  {Document cardiac monitor, telemetry assessment procedure when appropriate:1}  Medications Ordered in  ED Medications  ipratropium-albuterol (DUONEB) 0.5-2.5 (3) MG/3ML nebulizer solution 3 mL (3 mLs Nebulization Given 07/19/22 1828)  ibuprofen (ADVIL) tablet 600 mg (600 mg Oral Given 07/19/22 1857)    ED Course/ Medical Decision Making/ A&P   {   Click here for ABCD2, HEART and other calculatorsREFRESH Note before signing :1}                          Medical Decision Making Amount and/or Complexity of Data Reviewed Radiology: ordered.  Risk Prescription drug management.   ***  {Document critical care time when appropriate:1} {Document review of labs and clinical decision tools ie heart score, Chads2Vasc2 etc:1}  {Document your independent review of radiology images, and any outside records:1} {Document your discussion with family members, caretakers, and with consultants:1} {Document social determinants of health affecting pt's care:1} {Document your decision making why or why not admission, treatments were needed:1} Final Clinical Impression(s) / ED Diagnoses Final diagnoses:  None    Rx / DC Orders ED Discharge Orders     None

## 2022-07-19 NOTE — ED Notes (Signed)
Discharge paperwork reviewed entirely with patient, including Rx's and follow up care. Pain was under control. Pt verbalized understanding as well as all parties involved. No questions or concerns voiced at the time of discharge. No acute distress noted.   Pt ambulated out to PVA without incident or assistance.  

## 2022-07-19 NOTE — Discharge Instructions (Addendum)
You were seen in the ER today for evaluation of your left ear pain and cough and cold symptoms. You tested negative for COVID, flu, RSV. Your eardrum on the left does appear infected. I am starting you on an antibiotic called Amoxicillin which you will take twice daily for the next 7 days. This can cause diarrhea, so I recommend taking it with a probiotic or some cottage cheese/yogurt. Additionally, I am sending you home with albuterol inhaler to help with your cough. You can also try Robitussin for your cough as well. Use the Flonase as directed to help with the ear pressure. Please take Tylenol 1000mg  and/or ibuprofen 600mg  every 6 hours as needed for pain. If you have any worsening pain, swelling to the back of your ear, trouble turning your neck, fever, drainage from your ear, or trouble hearing, please come to the ER for re-evaluation. If you have any concerns, new or worsening symptoms, please return to the nearest ER for re-evaluation.   **I have also included the information for the [facility] and [facility] for a primary care provider. Please call to schedule an appointment!  Contact a doctor if: You have bleeding from your nose. There is a lump on your neck. You are not feeling better in 5 days. You feel worse instead of better. Get help right away if: You have pain that is not helped with medicine. You have swelling, redness, or pain around your ear. You get a stiff neck. You cannot move part of your face (paralysis). You notice that the bone behind your ear hurts when you touch it. You get a very bad headache.

## 2022-07-19 NOTE — ED Triage Notes (Signed)
Pt states left ear pain x 3 days  Denies any sinus congestion or runny nose  Denies fever  Denies drainage from ear

## 2022-07-29 ENCOUNTER — Emergency Department (HOSPITAL_COMMUNITY)
Admission: EM | Admit: 2022-07-29 | Discharge: 2022-07-29 | Disposition: A | Discharge status: Home / Self Care | Payer: 59 | Attending: Emergency Medicine | Admitting: Emergency Medicine

## 2022-07-29 ENCOUNTER — Encounter (HOSPITAL_COMMUNITY): Payer: Self-pay | Admitting: Pharmacy Technician

## 2022-07-29 ENCOUNTER — Other Ambulatory Visit: Payer: Self-pay

## 2022-07-29 DIAGNOSIS — H9202 Otalgia, left ear: Secondary | ICD-10-CM | POA: Insufficient documentation

## 2022-07-29 MED ORDER — AZITHROMYCIN 250 MG PO TABS: 250.0000 mg | ORAL_TABLET | Freq: Every day | ORAL | 0 refills | Status: DC

## 2022-07-29 MED ORDER — AZITHROMYCIN 250 MG PO TABS: 250.0000 mg | ORAL_TABLET | Freq: Every day | ORAL | 0 refills | Status: AC

## 2022-07-29 NOTE — ED Provider Notes (Signed)
[place] [facility] Provider Note   CSN: [id number] Arrival date & time: 07/29/22  0831     History  Chief Complaint  Patient presents with   Otalgia    Lawrence Robinson is a 37 y.o. male.  37 year old male presents with left-sided ear pain for past 11 days.  Completed a course of amoxicillin just recently.  Denies any fever or chills.  No drainage from the ear.  States he feels like he has pressure behind the eardrum.       Home Medications Prior to Admission medications   Medication Sig Start Date End Date Taking? Authorizing Provider  albuterol (VENTOLIN HFA) 108 (90 Base) MCG/ACT inhaler Inhale 1-2 puffs into the lungs every 6 (six) hours as needed for wheezing or shortness of breath. 07/19/22   Sherrell Puller, PA-C  fluticasone [facility]) 50 MCG/ACT nasal spray Place 1 spray into both nostrils daily. 07/19/22   Sherrell Puller, PA-C  HYDROcodone-acetaminophen (NORCO/VICODIN) 5-325 MG tablet Take 1 tablet by mouth every 6 (six) hours as needed. 03/31/22   Blue, Soijett A, PA-C      Allergies    Patient has no known allergies.    Review of Systems   Review of Systems  All other systems reviewed and are negative.   Physical Exam Updated Vital Signs BP 120/88   Pulse 67   Temp 97.9 F (36.6 C)   Resp 16   SpO2 99%  Physical Exam Vitals and nursing note reviewed.  Constitutional:      General: He is not in acute distress.    Appearance: Normal appearance. He is well-developed. He is not toxic-appearing.  HENT:     Head: Normocephalic and atraumatic.     Right Ear: Tympanic membrane, ear canal and external ear normal.  Eyes:     General: Lids are normal.     Conjunctiva/sclera: Conjunctivae normal.     Pupils: Pupils are equal, round, and reactive to light.  Neck:     Thyroid: No thyroid mass.     Trachea: No tracheal deviation.  Cardiovascular:     Rate and Rhythm: Normal rate and regular rhythm.     Heart sounds: Normal heart  sounds. No murmur heard.    No gallop.  Pulmonary:     Effort: Pulmonary effort is normal. No respiratory distress.     Breath sounds: Normal breath sounds. No stridor. No decreased breath sounds, wheezing, rhonchi or rales.  Abdominal:     General: There is no distension.     Palpations: Abdomen is soft.     Tenderness: There is no abdominal tenderness. There is no rebound.  Musculoskeletal:        General: No tenderness. Normal range of motion.     Cervical back: Normal range of motion and neck supple.  Skin:    General: Skin is warm and dry.     Findings: No abrasion or rash.  Neurological:     Mental Status: He is alert and oriented to person, place, and time. Mental status is at baseline.     GCS: GCS eye subscore is 4. GCS verbal subscore is 5. GCS motor subscore is 6.     Cranial Nerves: No cranial nerve deficit.     Sensory: No sensory deficit.     Motor: Motor function is intact.  Psychiatric:        Attention and Perception: Attention normal.        Speech: Speech normal.  Behavior: Behavior normal.     ED Results / Procedures / Treatments   Labs (all labs ordered are listed, but only abnormal results are displayed) Labs Reviewed - No data to display  EKG None  Radiology No results found.  Procedures Procedures    Medications Ordered in ED Medications - No data to display  ED Course/ Medical Decision Making/ A&P                             Medical Decision Making  Will place patient under for antibiotic.  Will also give patient referral to an ENT.  Patient encouraged to use decongestants        Final Clinical Impression(s) / ED Diagnoses Final diagnoses:  None    Rx / DC Orders ED Discharge Orders     None         Lacretia Leigh, MD 07/29/22 [phone]

## 2022-07-29 NOTE — ED Triage Notes (Signed)
Pt here with L sided otalgia for a week and a half. Seen previously and dx with a sinus infection and given amoxicillin. Pt completed treatment of abx.

## 2022-07-29 NOTE — Discharge Instructions (Addendum)
Use an over-the-counter decongestant as directed

## 2022-10-30 ENCOUNTER — Emergency Department (HOSPITAL_BASED_OUTPATIENT_CLINIC_OR_DEPARTMENT_OTHER)
Admission: EM | Admit: 2022-10-30 | Discharge: 2022-10-30 | Disposition: A | Discharge status: Home / Self Care | Payer: 59 | Attending: Emergency Medicine | Admitting: Emergency Medicine

## 2022-10-30 ENCOUNTER — Encounter (HOSPITAL_BASED_OUTPATIENT_CLINIC_OR_DEPARTMENT_OTHER): Payer: Self-pay | Admitting: Emergency Medicine

## 2022-10-30 ENCOUNTER — Emergency Department (HOSPITAL_BASED_OUTPATIENT_CLINIC_OR_DEPARTMENT_OTHER): Payer: 59

## 2022-10-30 DIAGNOSIS — D72829 Elevated white blood cell count, unspecified: Secondary | ICD-10-CM | POA: Insufficient documentation

## 2022-10-30 DIAGNOSIS — K610 Anal abscess: Secondary | ICD-10-CM | POA: Diagnosis present

## 2022-10-30 LAB — BASIC METABOLIC PANEL
Anion gap: 10 (ref 5–15)
BUN: 16 mg/dL (ref 6–20)
CO2: 23 mmol/L (ref 22–32)
Calcium: 10 mg/dL (ref 8.9–10.3)
Chloride: 104 mmol/L (ref 98–111)
Creatinine, Ser: 1.03 mg/dL (ref 0.61–1.24)
GFR, Estimated: >60 mL/min (ref >60)
Glucose, Bld: 111 mg/dL — ABNORMAL HIGH (ref 70–99)
Potassium: 3.2 mmol/L — ABNORMAL LOW (ref 3.5–5.1)
Sodium: 137 mmol/L (ref 135–145)

## 2022-10-30 LAB — CBC WITH DIFFERENTIAL/PLATELET
Abs Immature Granulocytes: 0.06 K/uL (ref 0.00–0.07)
Basophils Absolute: 0.1 K/uL (ref 0.0–0.1)
Basophils Relative: 1 %
Eosinophils Absolute: 0.3 K/uL (ref 0.0–0.5)
Eosinophils Relative: 2 %
HCT: 44.8 % (ref 39.0–52.0)
Hemoglobin: 15.2 g/dL (ref 13.0–17.0)
Immature Granulocytes: 1 %
Lymphocytes Relative: 20 %
Lymphs Abs: 2.6 K/uL (ref 0.7–4.0)
MCH: 30.5 pg (ref 26.0–34.0)
MCHC: 33.9 g/dL (ref 30.0–36.0)
MCV: 90 fL (ref 80.0–100.0)
Monocytes Absolute: 1.4 K/uL — ABNORMAL HIGH (ref 0.1–1.0)
Monocytes Relative: 11 %
Neutro Abs: 8.7 K/uL — ABNORMAL HIGH (ref 1.7–7.7)
Neutrophils Relative %: 65 %
Platelets: 196 K/uL (ref 150–400)
RBC: 4.98 MIL/uL (ref 4.22–5.81)
RDW: 13.2 % (ref 11.5–15.5)
WBC: 13.1 K/uL — ABNORMAL HIGH (ref 4.0–10.5)
nRBC: 0 % (ref 0.0–0.2)

## 2022-10-30 MED ORDER — FENTANYL CITRATE PF 50 MCG/ML IJ SOSY
50.0000 ug | PREFILLED_SYRINGE | Freq: Once | INTRAMUSCULAR | Status: AC
  Administered 2022-10-30: 50 ug via INTRAVENOUS
  Filled 2022-10-30: qty 1

## 2022-10-30 MED ORDER — DOXYCYCLINE HYCLATE 100 MG PO CAPS: 100.0000 mg | ORAL_CAPSULE | Freq: Two times a day (BID) | ORAL | 0 refills | Status: AC

## 2022-10-30 MED ORDER — HYDROCODONE-ACETAMINOPHEN 5-325 MG PO TABS: 1.0000 | ORAL_TABLET | Freq: Four times a day (QID) | ORAL | 0 refills | Status: AC | PRN

## 2022-10-30 MED ORDER — DOXYCYCLINE HYCLATE 100 MG PO TABS
100.0000 mg | ORAL_TABLET | Freq: Once | ORAL | Status: AC
  Administered 2022-10-30: 100 mg via ORAL
  Filled 2022-10-30: qty 1

## 2022-10-30 MED ORDER — LIDOCAINE-EPINEPHRINE (PF) 2 %-1:200000 IJ SOLN
10.0000 mL | Freq: Once | INTRAMUSCULAR | Status: AC
  Administered 2022-10-30: 10 mL
  Filled 2022-10-30: qty 20

## 2022-10-30 MED ORDER — HYDROCODONE-ACETAMINOPHEN 5-325 MG PO TABS
1.0000 | ORAL_TABLET | Freq: Once | ORAL | Status: AC
  Administered 2022-10-30: 1 via ORAL
  Filled 2022-10-30: qty 1

## 2022-10-30 MED ORDER — IOHEXOL 300 MG/ML  SOLN
100.0000 mL | Freq: Once | INTRAMUSCULAR | Status: AC | PRN
  Administered 2022-10-30: 100 mL via INTRAVENOUS

## 2022-10-30 NOTE — ED Provider Notes (Signed)
[place] [facility]  Provider Note  CSN: [id number] Arrival date & time: 10/30/22 0502  History Chief Complaint  Patient presents with   Abscess    Lawrence Robinson is a 37 y.o. male with history of perianal abscess spontaneously drained in Dec 2023, here for 1 days of worsening pain and swelling that same area. No fever. Pain is worse with sitting.    Home Medications Prior to Admission medications   Medication Sig Start Date End Date Taking? Authorizing Provider  doxycycline (VIBRAMYCIN) 100 MG capsule Take 1 capsule (100 mg total) by mouth 2 (two) times daily. 10/30/22  Yes Pollyann Savoy, MD  HYDROcodone-acetaminophen (NORCO/VICODIN) 5-325 MG tablet Take 1 tablet by mouth every 6 (six) hours as needed for severe pain. 10/30/22  Yes Pollyann Savoy, MD  albuterol (VENTOLIN HFA) 108 (90 Base) MCG/ACT inhaler Inhale 1-2 puffs into the lungs every 6 (six) hours as needed for wheezing or shortness of breath. 07/19/22   Achille Rich, PA-C  azithromycin (ZITHROMAX) 250 MG tablet Take 1 tablet (250 mg total) by mouth daily. Take first 2 tablets together, then 1 every day until finished. 07/29/22   Lorre Nick, MD  fluticasone (FLONASE) 50 MCG/ACT nasal spray Place 1 spray into both nostrils daily. 07/19/22   Achille Rich, PA-C     Allergies    Patient has no known allergies.   Review of Systems   Review of Systems Please see HPI for pertinent positives and negatives  Physical Exam BP 110/70 (BP Location: Left Arm)   Pulse 64   Temp 98.2 F (36.8 C) (Oral)   Resp 18   Ht 6' (1.829 m)   Wt 79.8 kg   SpO2 99%   BMI 23.87 kg/m   Physical Exam Vitals and nursing note reviewed. Exam conducted with a chaperone present.  HENT:     Head: Normocephalic.     Nose: Nose normal.  Eyes:     Extraocular Movements: Extraocular movements intact.  Pulmonary:     Effort: Pulmonary effort is normal.  Genitourinary:    Comments: Erythema,  induration and tenderness without obvious fluctuance on L perineum, not immediately adjacent to anus.  Musculoskeletal:        General: Normal range of motion.     Cervical back: Neck supple.  Skin:    Findings: No rash (on exposed skin).  Neurological:     Mental Status: He is alert and oriented to person, place, and time.  Psychiatric:        Mood and Affect: Mood normal.     ED Results / Procedures / Treatments   EKG None  Procedures .Marland KitchenIncision and Drainage  Date/Time: 10/30/2022 6:50 AM  Performed by: Pollyann Savoy, MD Authorized by: Pollyann Savoy, MD   Consent:    Consent obtained:  Verbal   Consent given by:  Patient Location:    Type:  Abscess   Location:  Anogenital   Anogenital location:  Perianal Pre-procedure details:    Skin preparation:  Povidone-iodine Sedation:    Sedation type:  None Anesthesia:    Anesthesia method:  Local infiltration   Local anesthetic:  Lidocaine 2% WITH epi Procedure type:    Complexity:  Complex Procedure details:    Incision types:  Single straight   Incision depth:  Submucosal   Wound management:  Probed and deloculated   Drainage:  Purulent   Drainage amount:  Copious   Wound treatment:  Drain placed  Packing materials:  1/2 in iodoform gauze Post-procedure details:    Procedure completion:  Tolerated well, no immediate complications   Medications Ordered in the ED Medications  HYDROcodone-acetaminophen (NORCO/VICODIN) 5-325 MG per tablet 1 tablet (has no administration in time range)  doxycycline (VIBRA-TABS) tablet 100 mg (has no administration in time range)  iohexol (OMNIPAQUE) 300 MG/ML solution 100 mL (100 mLs Intravenous Contrast Given 10/30/22 0608)  fentaNYL (SUBLIMAZE) injection 50 mcg (50 mcg Intravenous Given 10/30/22 0613)  lidocaine-EPINEPHrine (XYLOCAINE W/EPI) 2 %-1:200000 (PF) injection 10 mL (10 mLs Infiltration Given 10/30/22 1610)    Initial Impression and Plan  Patient here with signs of  perineal cellulitis and maybe abscess. No definite fluctuance on exam. Will check labs and send for CT to further evaluate.   ED Course   Clinical Course as of 10/30/22 0653  Sat Oct 30, 2022  0541 CBC with mild leukocytosis. BMP is unremarkable.  [CS]  [phone] I personally viewed the images from radiology studies and agree with radiologist interpretation: CT shows uncomplicated perianal abscess.  Patient given fentanyl for pain and abscess drained as above. Plan discharge with Rx for pain meds, Abx and recommend 48 hour follow up for wound check.  [CS]    Clinical Course User Index [CS] Pollyann Savoy, MD     MDM Rules/Calculators/A&P Medical Decision Making Problems Addressed: Perianal abscess: acute illness or injury  Amount and/or Complexity of Data Reviewed Labs: ordered. Decision-making details documented in ED Course. Radiology: ordered and independent interpretation performed. Decision-making details documented in ED Course.  Risk Prescription drug management. Parenteral controlled substances.     Final Clinical Impression(s) / ED Diagnoses Final diagnoses:  Perianal abscess    Rx / DC Orders ED Discharge Orders          Ordered    HYDROcodone-acetaminophen (NORCO/VICODIN) 5-325 MG tablet  Every 6 hours PRN        10/30/22 0652    doxycycline (VIBRAMYCIN) 100 MG capsule  2 times daily        10/30/22 5409             Pollyann Savoy, MD 10/30/22 [phone]

## 2022-10-30 NOTE — ED Triage Notes (Signed)
Abscess on right buttock, has Hx of same X 1 day

## 2023-05-11 ENCOUNTER — Other Ambulatory Visit (HOSPITAL_COMMUNITY): Payer: Self-pay
